# Patient Record
Sex: Female | Born: 1980 | Race: White | Hispanic: No | Smoking: Never smoker
Health system: Southern US, Community
[De-identification: ages and names within clinical notes are randomized; demographics above are authoritative.]

## PROBLEM LIST (undated history)

## (undated) DIAGNOSIS — F329 Major depressive disorder, single episode, unspecified: Secondary | ICD-10-CM

## (undated) DIAGNOSIS — F32A Depression, unspecified: Secondary | ICD-10-CM

## (undated) DIAGNOSIS — G43909 Migraine, unspecified, not intractable, without status migrainosus: Secondary | ICD-10-CM

---

## 2015-06-23 ENCOUNTER — Emergency Department (HOSPITAL_COMMUNITY)
Admission: EM | Admit: 2015-06-23 | Discharge: 2015-06-23 | Discharge status: Against Medical Advice | Payer: Self-pay | Attending: Emergency Medicine | Admitting: Emergency Medicine

## 2015-06-23 ENCOUNTER — Encounter (HOSPITAL_COMMUNITY): Payer: Self-pay | Admitting: Emergency Medicine

## 2015-06-23 ENCOUNTER — Emergency Department (HOSPITAL_COMMUNITY): Payer: Self-pay

## 2015-06-23 ENCOUNTER — Ambulatory Visit (HOSPITAL_COMMUNITY): Payer: Self-pay

## 2015-06-23 DIAGNOSIS — F419 Anxiety disorder, unspecified: Secondary | ICD-10-CM | POA: Insufficient documentation

## 2015-06-23 DIAGNOSIS — Z5321 Procedure and treatment not carried out due to patient leaving prior to being seen by health care provider: Secondary | ICD-10-CM

## 2015-06-23 DIAGNOSIS — R079 Chest pain, unspecified: Secondary | ICD-10-CM | POA: Insufficient documentation

## 2015-06-23 HISTORY — DX: Major depressive disorder, single episode, unspecified: F32.9

## 2015-06-23 HISTORY — DX: Migraine, unspecified, not intractable, without status migrainosus: G43.909

## 2015-06-23 HISTORY — DX: Depression, unspecified: F32.A

## 2015-06-23 LAB — BASIC METABOLIC PANEL
Anion gap: 8 (ref 5–15)
BUN: 8 mg/dL (ref 6–20)
CO2: 24 mmol/L (ref 22–32)
Calcium: 9.3 mg/dL (ref 8.9–10.3)
Chloride: 107 mmol/L (ref 101–111)
Creatinine, Ser: 0.69 mg/dL (ref 0.44–1.00)
GFR calc Af Amer: >60 mL/min (ref >60)
GLUCOSE: 109 mg/dL — AB (ref 65–99)
POTASSIUM: 3.6 mmol/L (ref 3.5–5.1)
Sodium: 139 mmol/L (ref 135–145)

## 2015-06-23 LAB — CBC
HEMATOCRIT: 40.3 % (ref 36.0–46.0)
Hemoglobin: 14.1 g/dL (ref 12.0–15.0)
MCH: 31.3 pg (ref 26.0–34.0)
MCHC: 35 g/dL (ref 30.0–36.0)
MCV: 89.6 fL (ref 78.0–100.0)
Platelets: 243 10*3/uL (ref 150–400)
RBC: 4.5 MIL/uL (ref 3.87–5.11)
RDW: 11.4 % — AB (ref 11.5–15.5)
WBC: 7.4 10*3/uL (ref 4.0–10.5)

## 2015-06-23 LAB — I-STAT TROPONIN, ED: Troponin i, poc: 0 ng/mL (ref 0.00–0.08)

## 2015-06-23 NOTE — ED Notes (Signed)
Patient called again for room assignment with no answer

## 2015-06-23 NOTE — ED Notes (Signed)
Patient called twice for room assignment. Radiology has called patient for xray with no answer.

## 2015-06-23 NOTE — ED Notes (Signed)
Pt c/o chest pain and anxiety intermittent x 1 month. States she feels she is having a panic attack, although she has never had one before. Has not been seen for the same. Pt states she does have a history of depression and has been crying a lot due to stress at home. Denies SI/HI.

## 2018-06-09 ENCOUNTER — Emergency Department (HOSPITAL_BASED_OUTPATIENT_CLINIC_OR_DEPARTMENT_OTHER): Payer: 59

## 2018-06-09 ENCOUNTER — Other Ambulatory Visit: Payer: Self-pay

## 2018-06-09 ENCOUNTER — Emergency Department (HOSPITAL_BASED_OUTPATIENT_CLINIC_OR_DEPARTMENT_OTHER)
Admission: EM | Admit: 2018-06-09 | Discharge: 2018-06-09 | Disposition: A | Discharge status: Home / Self Care | Payer: 59 | Attending: Emergency Medicine | Admitting: Emergency Medicine

## 2018-06-09 ENCOUNTER — Encounter (HOSPITAL_BASED_OUTPATIENT_CLINIC_OR_DEPARTMENT_OTHER): Payer: Self-pay | Admitting: Emergency Medicine

## 2018-06-09 DIAGNOSIS — S161XXA Strain of muscle, fascia and tendon at neck level, initial encounter: Secondary | ICD-10-CM | POA: Diagnosis not present

## 2018-06-09 DIAGNOSIS — Y999 Unspecified external cause status: Secondary | ICD-10-CM | POA: Insufficient documentation

## 2018-06-09 DIAGNOSIS — Y92410 Unspecified street and highway as the place of occurrence of the external cause: Secondary | ICD-10-CM | POA: Insufficient documentation

## 2018-06-09 DIAGNOSIS — Y939 Activity, unspecified: Secondary | ICD-10-CM | POA: Insufficient documentation

## 2018-06-09 DIAGNOSIS — S0990XA Unspecified injury of head, initial encounter: Secondary | ICD-10-CM

## 2018-06-09 DIAGNOSIS — R0789 Other chest pain: Secondary | ICD-10-CM | POA: Diagnosis not present

## 2018-06-09 MED ORDER — METHOCARBAMOL 500 MG PO TABS: 500.0000 mg | ORAL_TABLET | Freq: Three times a day (TID) | ORAL | 0 refills | Status: AC | PRN

## 2018-06-09 MED ORDER — IBUPROFEN 800 MG PO TABS: 800.0000 mg | ORAL_TABLET | Freq: Three times a day (TID) | ORAL | 0 refills | Status: AC | PRN

## 2018-06-09 MED ORDER — ONDANSETRON 4 MG PO TBDP: 4.0000 mg | ORAL_TABLET | Freq: Three times a day (TID) | ORAL | 0 refills | Status: AC | PRN

## 2018-06-09 MED ORDER — IBUPROFEN 800 MG PO TABS
800.0000 mg | ORAL_TABLET | Freq: Once | ORAL | Status: AC
  Administered 2018-06-09: 800 mg via ORAL
  Filled 2018-06-09: qty 1

## 2018-06-09 MED FILL — ONDANSETRON ODT 4 MG TABLET: 4 | 7 days supply | Qty: 20 | Fill #0

## 2018-06-09 MED FILL — METHOCARBAMOL 500 MG TABLET: 500 | 7 days supply | Qty: 20 | Fill #0

## 2018-06-09 MED FILL — IBUPROFEN 800 MG TAB: 800 | 7 days supply | Qty: 21 | Fill #0

## 2018-06-09 NOTE — ED Notes (Addendum)
Pt is in radiology, family at bedside. Report received, pt care assumed.
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
## 2018-06-09 NOTE — Discharge Instructions (Signed)
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
